# Patient Record
Sex: Male | Born: 1995 | Race: White | Hispanic: No | Marital: Single | Smoking: Never smoker
Health system: Southern US, Community
[De-identification: ages and names within clinical notes are randomized; demographics above are authoritative.]

---
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
## 2016-01-14 IMAGING — US US SCROTUM
1 series · 13 of 25 positions shown · non-contrast
Comparison: None.

CLINICAL DATA: Right testicular and groin pain and swelling.

EXAM:
SCROTAL ULTRASOUND
DOPPLER ULTRASOUND OF THE TESTICLES
TECHNIQUE: Complete ultrasound examination of the testicles, epididymis, and
other scrotal structures was performed. Color and spectral Doppler
ultrasound were also utilized to evaluate blood flow to the
testicles.

[Series 1: us scrotum · 0.06mm/px · 13 of 66 slices shown]
[im 1/66]
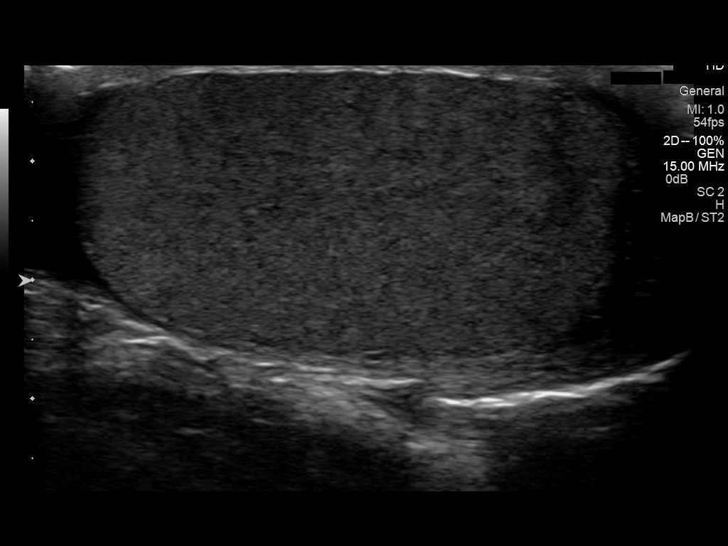
[im 6/66]
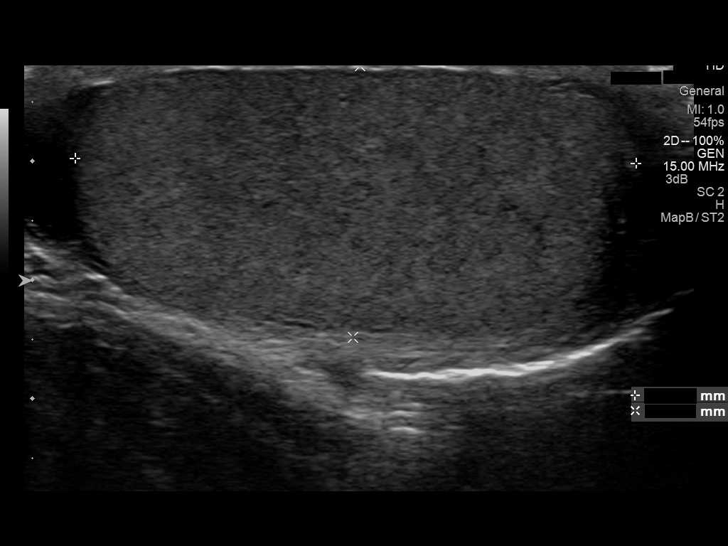
[im 11/66]
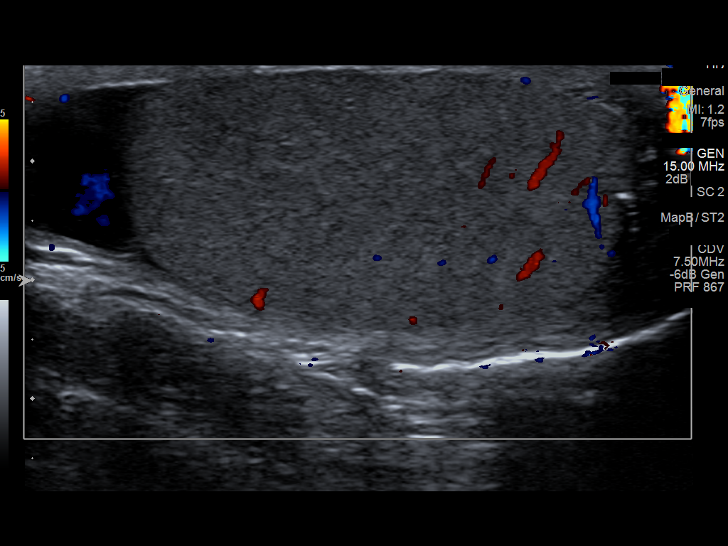
[im 17/66]
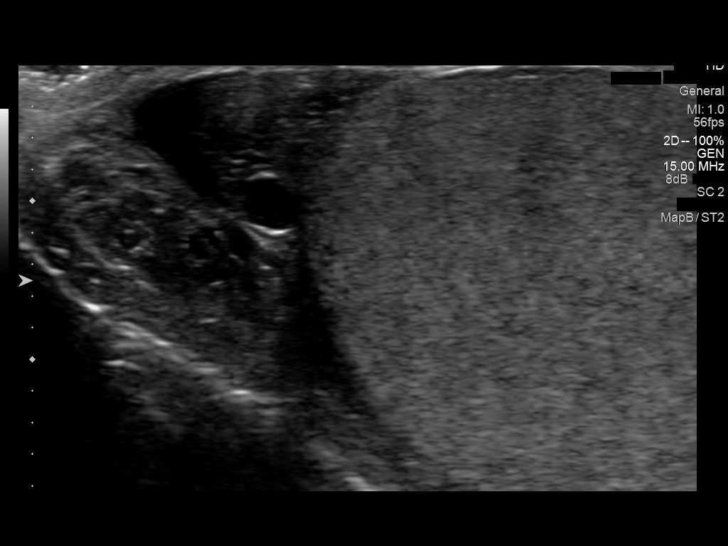
[im 22/66]
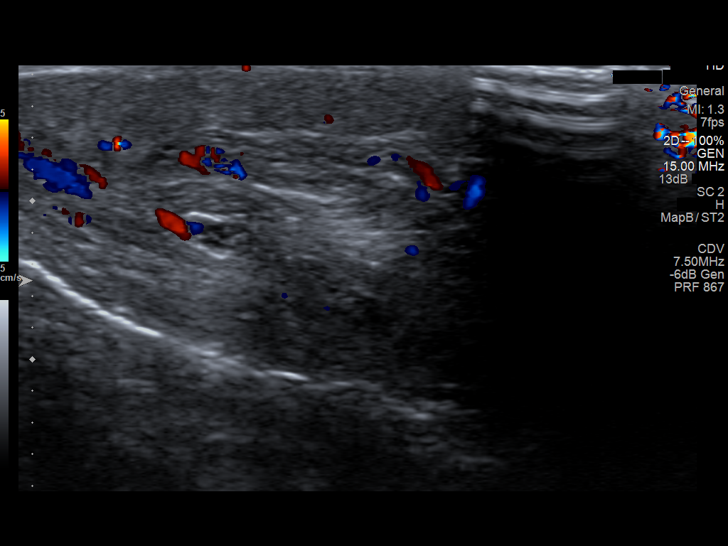
[im 28/66]
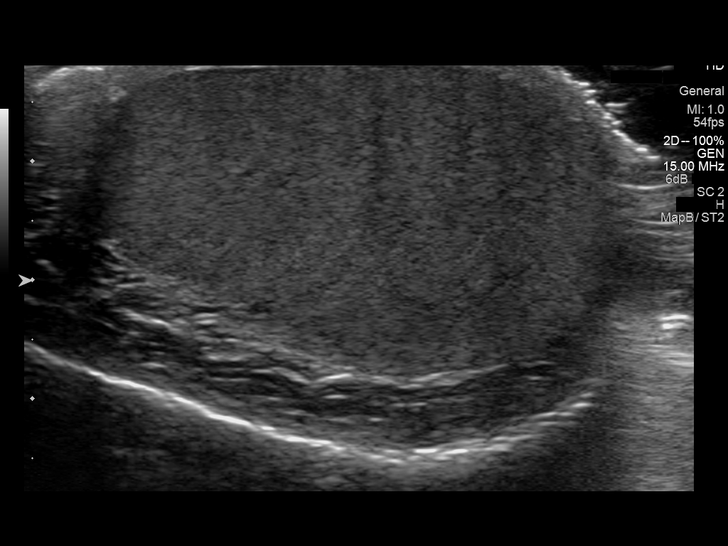
[im 33/66]
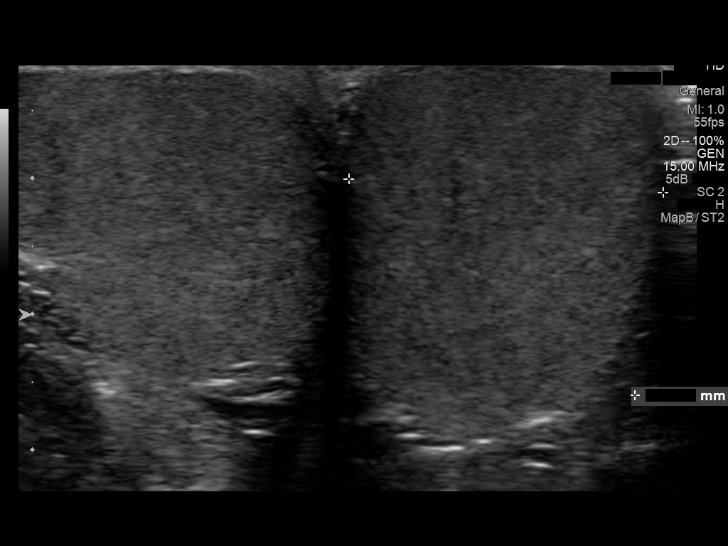
[im 38/66]
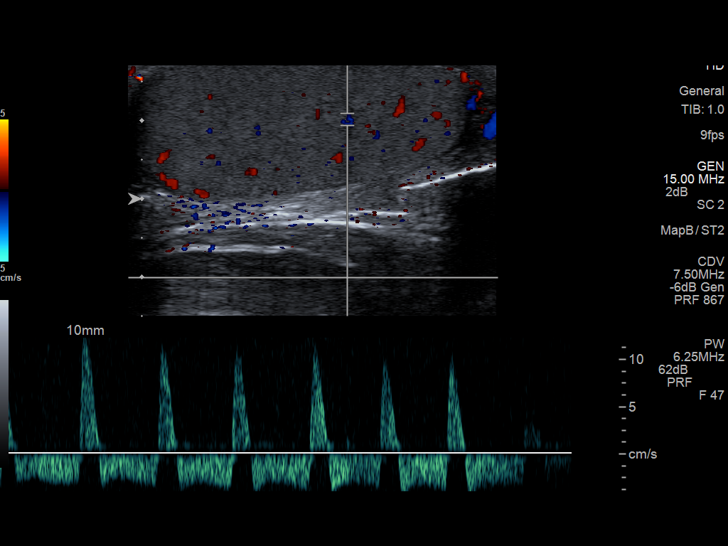
[im 44/66]
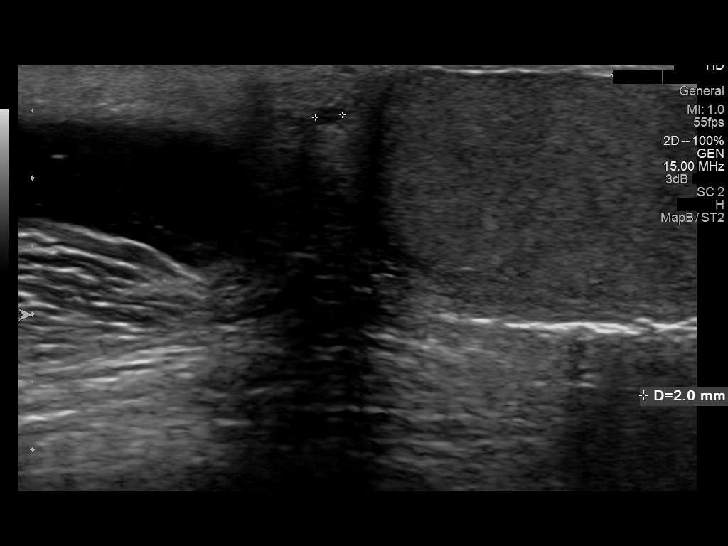
[im 49/66]
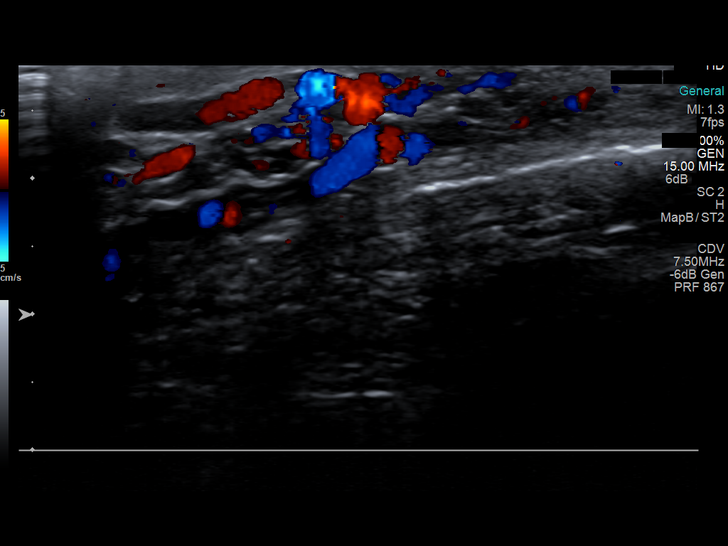
[im 55/66]
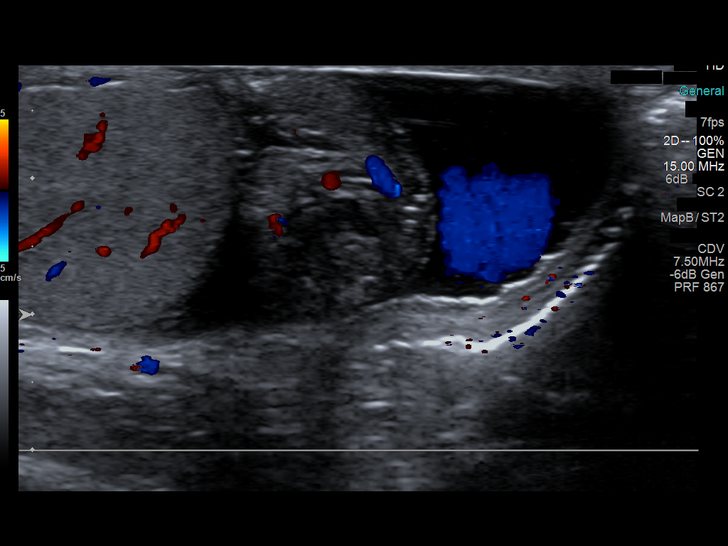
[im 60/66]
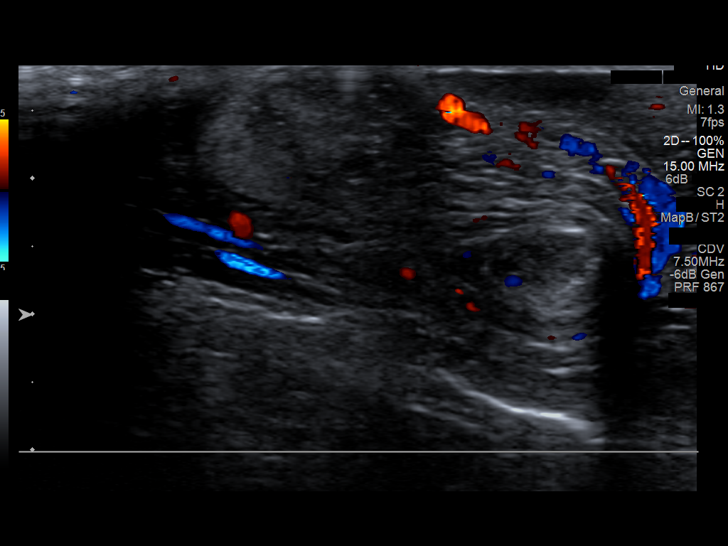
[im 66/66]
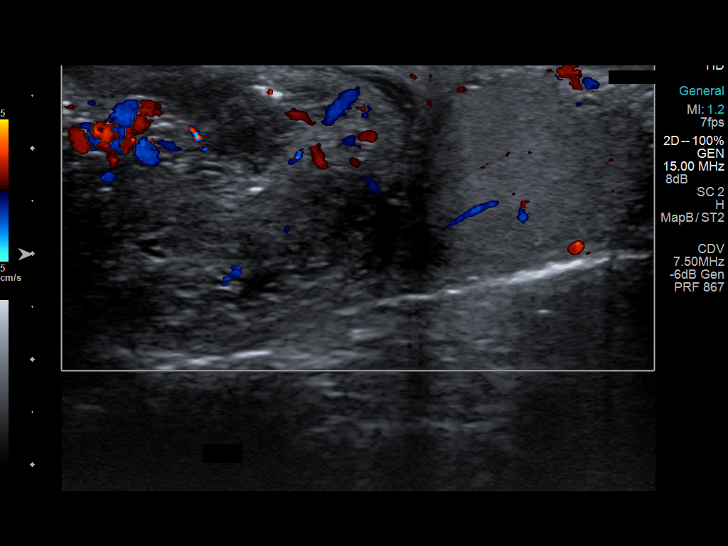

[13 of 25 positions shown; findings below may reference images not displayed]

FINDINGS: Right testicle

Measurements: 4.7 x 2.3 x 2.8 cm. No mass or microlithiasis
visualized.

Left testicle

Measurements: 4.4 x 2.4 x 2.3 cm. No mass or microlithiasis
visualized.

Right epididymis: A small 0.4 cm cyst is noted at the epididymal
head. There is asymmetric prominence of the right epididymal tail,
extending inferior to the right testis, with apparent associated
hyperemia.

Left epididymis: A 0.2 cm cyst is noted at the left epididymal head.
The left epididymis is otherwise unremarkable.

Hydrocele:  A small right-sided hydrocele is incidentally seen.

Varicocele:  None visualized.

Pulsed Doppler interrogation of both testes demonstrates low
resistance arterial and venous waveforms bilaterally.
IMPRESSION: 1. Asymmetric prominence of the right epididymal tail, extending
inferior to the right testis, with associated hyperemia. This may
reflect acute right-sided epididymitis.
2. Small right hydrocele seen.
3. No evidence for testicular torsion.
4. Tiny bilateral epididymal head cysts noted.

## 2016-06-14 ENCOUNTER — Ambulatory Visit (HOSPITAL_COMMUNITY)
Admission: EM | Admit: 2016-06-14 | Discharge: 2016-06-14 | Disposition: A | Discharge status: Home / Self Care | Payer: Medicaid Other | Attending: Family Medicine | Admitting: Family Medicine

## 2016-06-14 ENCOUNTER — Encounter (HOSPITAL_COMMUNITY): Payer: Self-pay | Admitting: Emergency Medicine

## 2016-06-14 DIAGNOSIS — J039 Acute tonsillitis, unspecified: Secondary | ICD-10-CM | POA: Diagnosis not present

## 2016-06-14 DIAGNOSIS — J029 Acute pharyngitis, unspecified: Secondary | ICD-10-CM | POA: Diagnosis present

## 2016-06-14 LAB — POCT RAPID STREP A: Streptococcus, Group A Screen (Direct): NEGATIVE

## 2016-06-14 MED ORDER — BENZONATATE 100 MG PO CAPS: 100.0000 mg | ORAL_CAPSULE | Freq: Three times a day (TID) | ORAL | 0 refills | Status: AC | PRN

## 2016-06-14 MED ORDER — AMOXICILLIN 875 MG PO TABS
875.0000 mg | ORAL_TABLET | Freq: Two times a day (BID) | ORAL | 0 refills | Status: AC
Start: 2016-06-14 — End: ?

## 2016-06-14 NOTE — ED Triage Notes (Signed)
PT reports tonsil swelling for 4 days. SOB due to swelling. No SOB when breathing through nose

## 2016-06-14 NOTE — ED Provider Notes (Signed)
[facility]    CSN: [id number] Arrival date & time: 06/14/16  1252     History   Chief Complaint Chief Complaint  Patient presents with  . Sore Throat    HPI Scott Combs is a 21 y.o. male.   This is a 21 year old man who presents with sore throat and mild cough since Monday after going to a concert on Sunday night.  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  No h/o asthma.  Notes swollen glands.      History reviewed. No pertinent past medical history.  There are no active problems to display for this patient.   History reviewed. No pertinent surgical history.     Home Medications    Prior to Admission medications   Medication Sig Start Date End Date Taking? Authorizing Provider  amoxicillin (AMOXIL) 875 MG tablet Take 1 tablet (875 mg total) by mouth 2 (two) times daily. 06/14/16   Elvina Sidle, MD  benzonatate (TESSALON) 100 MG capsule Take 1-2 capsules (100-200 mg total) by mouth 3 (three) times daily as needed for cough. 06/14/16   Elvina Sidle, MD    Family History No family history on file.  Social History Social History  Substance Use Topics  . Smoking status: Never Smoker  . Smokeless tobacco: Never Used  . Alcohol use No     Allergies   Patient has no known allergies.   Review of Systems Review of Systems  Constitutional: Negative.   HENT: Positive for sore throat.   Respiratory: Positive for cough.   Cardiovascular: Negative.   Gastrointestinal: Negative.   Genitourinary: Negative.   Musculoskeletal: Negative.   Neurological: Negative.   All other systems reviewed and are negative.    Physical Exam Triage Vital Signs ED Triage Vitals  Enc Vitals Group     BP      Pulse      Resp      Temp      Temp src      SpO2      Weight      Height      Head Circumference      Peak Flow      Pain Score      Pain Loc      Pain Edu?      Excl. in GC?    No data  found.   Updated Vital Signs BP 102/70   Pulse 97   Temp 99.4 F (37.4 C) (Oral)   Resp 16   Ht  (1.803 m)   Wt 146 lb (66.2 kg)   SpO2 97%   BMI 20.36 kg/m    Physical Exam  Constitutional: He is oriented to person, place, and time. He appears well-developed and well-nourished.  HENT:  Head: Normocephalic.  Right Ear: External ear normal.  Left Ear: External ear normal.  Swollen tonsils with thin exudate  Eyes: Conjunctivae are normal. Pupils are equal, round, and reactive to light.  Neck: Normal range of motion. Neck supple.  Cardiovascular: Normal rate, regular rhythm and normal heart sounds.   Pulmonary/Chest: Effort normal and breath sounds normal.  Musculoskeletal: Normal range of motion.  Lymphadenopathy:    He has cervical adenopathy.  Neurological: He is alert and oriented to person, place, and time.  Skin: Skin is warm and dry.  Nursing note and vitals reviewed.    UC Treatments / Results  Labs (all labs ordered are listed,  but only abnormal results are displayed) Results for orders placed or performed during the hospital encounter of 06/14/16  POCT rapid strep A [facility])  Result Value Ref Range   Streptococcus, Group A Screen (Direct) NEGATIVE NEGATIVE     EKG  EKG Interpretation None       Radiology No results found.  Procedures Procedures (including critical care time)  Medications Ordered in UC Medications - No data to display   Initial Impression / Assessment and Plan / UC Course  I have reviewed the triage vital signs and the nursing notes.  Pertinent labs & imaging results that were available during my care of the patient were reviewed by me and considered in my medical decision making (see chart for details).     Final Clinical Impressions(s) / UC Diagnoses   Final diagnoses:  Tonsillitis    New Prescriptions New Prescriptions   AMOXICILLIN (AMOXIL) 875 MG TABLET    Take 1 tablet (875 mg total) by mouth 2  (two) times daily.   BENZONATATE (TESSALON) 100 MG CAPSULE    Take 1-2 capsules (100-200 mg total) by mouth 3 (three) times daily as needed for cough.     Elvina Sidle, MD 06/14/16 1344

## 2016-06-17 LAB — CULTURE, GROUP A STREP (THRC)
